# Patient Record
Sex: Male | Born: 1990
Health system: Southern US, Community
[De-identification: ages and names within clinical notes are randomized; demographics above are authoritative.]

## PROBLEM LIST (undated history)

## (undated) DIAGNOSIS — L649 Androgenic alopecia, unspecified: Secondary | ICD-10-CM

## (undated) HISTORY — DX: Androgenic alopecia, unspecified: L64.9

---

## 2011-06-15 ENCOUNTER — Emergency Department: Payer: Self-pay | Admitting: Emergency Medicine

## 2013-04-06 IMAGING — CR RIGHT ELBOW - COMPLETE 3+ VIEW
1 series · 4 of 4 positions shown · non-contrast
Comparison: none

REASON FOR EXAM: trauma
COMMENTS:

[Series 1: lat · 0.17mm/px · 4 of 4 slices shown]
[im 1/4]
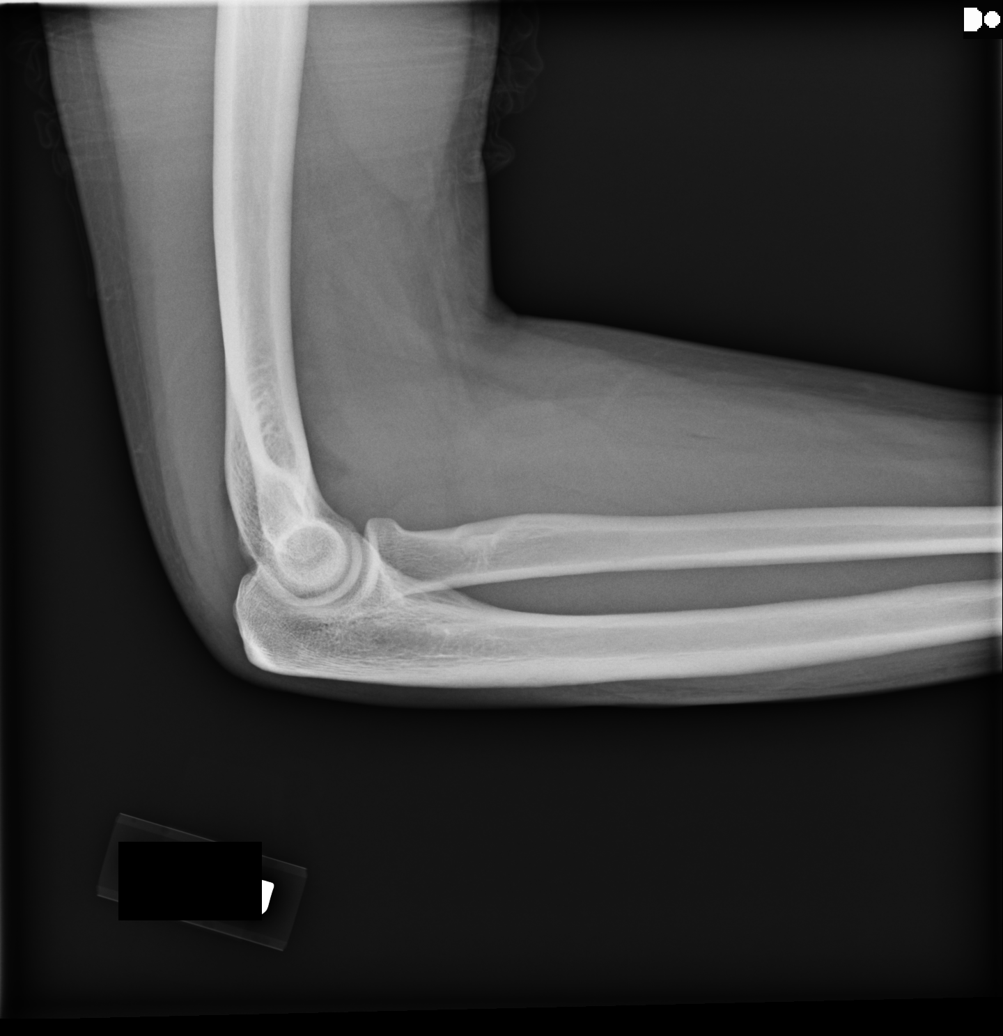
[im 2/4]
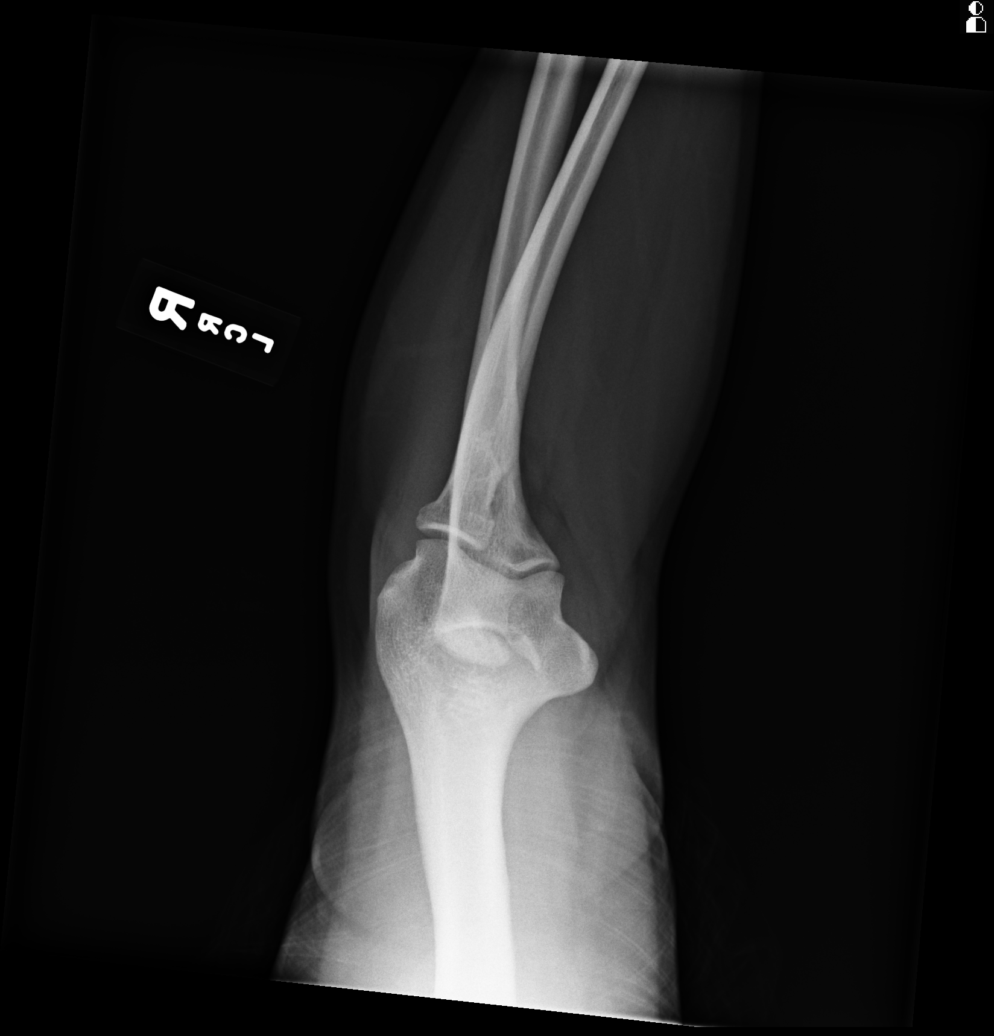
[im 3/4]
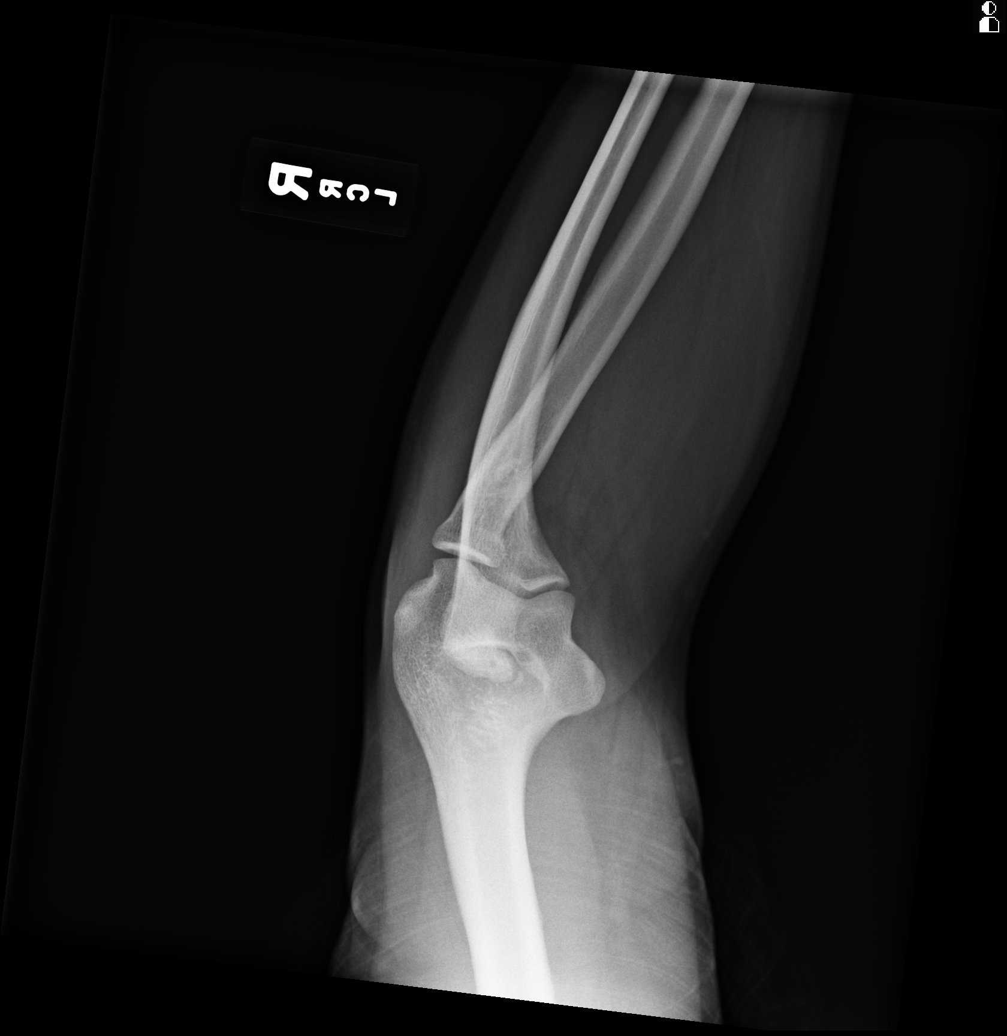
[im 4/4]
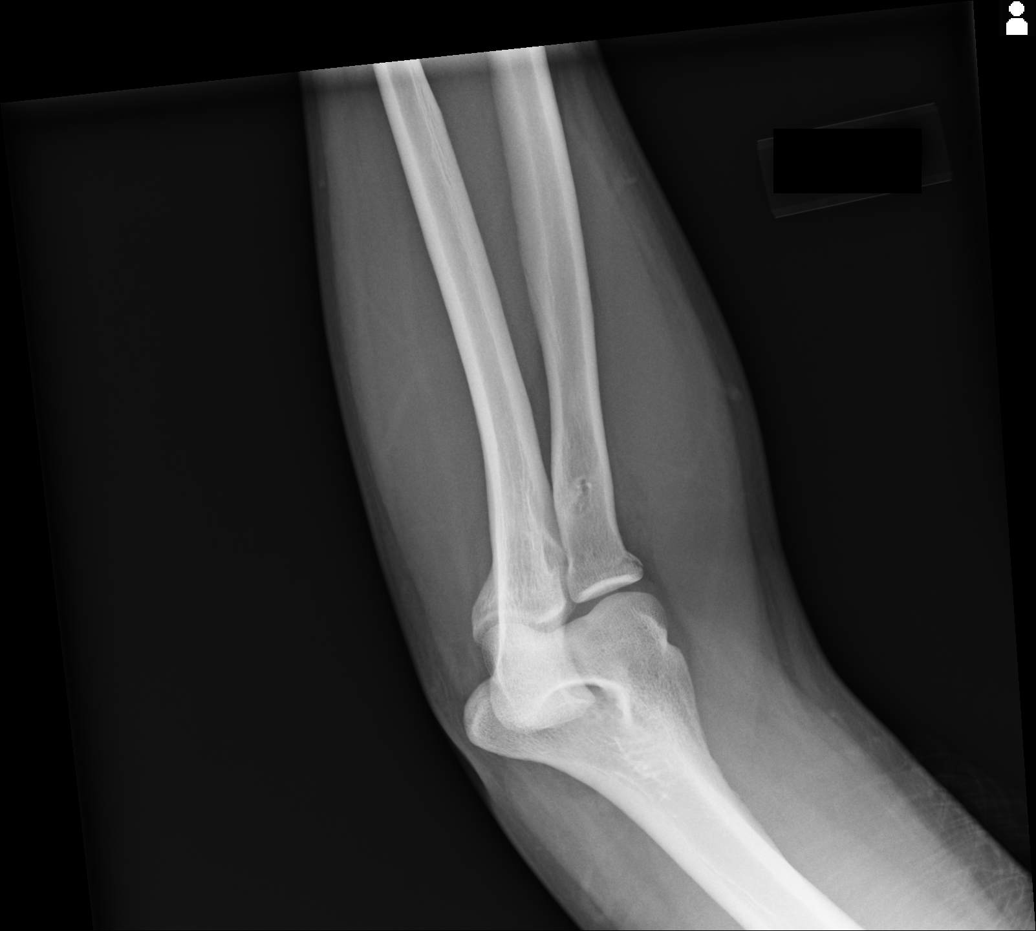

[4 of 4 positions shown; findings below may reference images not displayed]

PROCEDURE:     DXR - DXR ELBOW RT COMP W/OBLIQUES  - June 15, 2011  [DATE]

RESULT:     There is elevation of the anterior distal humeral fat pad
compatible with posttraumatic joint effusion. There is noted a minimally
displaced fracture of the radial head. No other fractures about the elbow
are seen.
IMPRESSION: 1. Fracture of the radial head, essentially nondisplaced.
2. There is elevation of the anterior distal humeral fat pad compatible with
posttraumatic joint effusion.

## 2019-12-10 ENCOUNTER — Encounter: Payer: Self-pay | Admitting: Internal Medicine

## 2019-12-10 ENCOUNTER — Ambulatory Visit (INDEPENDENT_AMBULATORY_CARE_PROVIDER_SITE_OTHER): Payer: Commercial Managed Care - PPO | Admitting: Internal Medicine

## 2019-12-10 ENCOUNTER — Other Ambulatory Visit: Payer: Self-pay

## 2019-12-10 VITALS — BP 145/89 | HR 113 | Ht 67.0 in | Wt 244.2 lb

## 2019-12-10 DIAGNOSIS — E669 Obesity, unspecified: Secondary | ICD-10-CM

## 2019-12-10 DIAGNOSIS — R438 Other disturbances of smell and taste: Secondary | ICD-10-CM | POA: Diagnosis not present

## 2019-12-10 DIAGNOSIS — L649 Androgenic alopecia, unspecified: Secondary | ICD-10-CM | POA: Diagnosis not present

## 2019-12-10 DIAGNOSIS — R430 Anosmia: Secondary | ICD-10-CM | POA: Diagnosis not present

## 2019-12-10 NOTE — Assessment & Plan Note (Signed)
-   I encouraged the patient to lose weight.  - I educated them on making healthy dietary choices including eating more fruits and vegetables and less fried foods. - I encouraged the patient to exercise more, and educated on the benefits of exercise including weight loss, diabetes prevention, and hypertension prevention.   

## 2019-12-10 NOTE — Assessment & Plan Note (Signed)
Obvious etiology of the    baldness is present

## 2019-12-10 NOTE — Assessment & Plan Note (Signed)
Patient sinus examination and throat examination is normal.  Advised to be tested for Covid.

## 2019-12-10 NOTE — Progress Notes (Signed)
Established Patient Office Visit  SUBJECTIVE:  Subjective  Patient ID: Luis Leblanc, male    DOB: 10/11/1990  Age: 29 y.o. MRN: [id number]  CC:  Chief Complaint  Patient presents with  . URI  . dry mouth    saliva taste bitter and having trouble tasting certain things    HPI Luis Leblanc is a 29 y.o. male presenting today for general checkup.  He complains of his tastet to be impaired.  He complains of water tasting bitter.  He cannot smell food,      He was not exposed to anyone with Covid.  Complaining of cough with without any phlegm.  Diarrhea  yesterday no history of nausea vomiting or fever.  Planing of back pain which is getting better denies any history of myalgia.  Past Medical History:  Diagnosis Date  . Male pattern baldness      History reviewed. No pertinent family history.  Social History   Socioeconomic History  . Marital status: Unknown    Spouse name: Not on file  . Number of children: Not on file  . Years of education: Not on file  . Highest education level: Not on file  Occupational History  . Not on file  Tobacco Use  . Smoking status: Never Smoker  . Smokeless tobacco: Never Used  Substance and Sexual Activity  . Alcohol use: Not Currently  . Drug use: Never  . Sexual activity: Not on file  Other Topics Concern  . Not on file  Social History Narrative  . Not on file   Social Determinants of Health   Financial Resource Strain:   . Difficulty of Paying Living Expenses: Not on file  Food Insecurity:   . Worried About Charity fundraiser in the Last Year: Not on file  . Ran Out of Food in the Last Year: Not on file  Transportation Needs:   . Lack of Transportation (Medical): Not on file  . Lack of Transportation (Non-Medical): Not on file  Physical Activity:   . Days of Exercise per Week: Not on file  . Minutes of Exercise per Session: Not on file  Stress:   . Feeling of Stress : Not on file  Social Connections:   . Frequency of  Communication with Friends and Family: Not on file  . Frequency of Social Gatherings with Friends and Family: Not on file  . Attends Religious Services: Not on file  . Active Member of Clubs or Organizations: Not on file  . Attends Archivist Meetings: Not on file  . Marital Status: Not on file  Intimate Partner Violence:   . Fear of Current or Ex-Partner: Not on file  . Emotionally Abused: Not on file  . Physically Abused: Not on file  . Sexually Abused: Not on file    No current outpatient medications on file.   No Known Allergies  ROS Review of Systems  Constitutional: Positive for fatigue.  HENT: Positive for sinus pressure and sore throat (taste is bitter  and  cant smell).   Eyes: Negative.   Respiratory: Negative.   Cardiovascular: Negative.   Gastrointestinal: Negative.   Endocrine: Negative.   Genitourinary: Negative.   Musculoskeletal: Negative.   Skin: Negative.   Allergic/Immunologic: Negative.   Neurological: Negative.   Hematological: Negative.   Psychiatric/Behavioral: Negative.   All other systems reviewed and are negative.    OBJECTIVE:    Physical Exam Vitals reviewed.  Constitutional:      Appearance: Normal  appearance.  HENT:     Mouth/Throat:     Mouth: Mucous membranes are moist.  Eyes:     Pupils: Pupils are equal, round, and reactive to light.  Neck:     Vascular: No carotid bruit.  Cardiovascular:     Rate and Rhythm: Normal rate and regular rhythm.     Pulses: Normal pulses.     Heart sounds: Normal heart sounds.  Pulmonary:     Effort: Pulmonary effort is normal.     Breath sounds: Normal breath sounds.  Abdominal:     General: Bowel sounds are normal.     Palpations: Abdomen is soft. There is no hepatomegaly, splenomegaly or mass.     Tenderness: There is no abdominal tenderness.     Hernia: No hernia is present.  Musculoskeletal:     Cervical back: Neck supple.     Right lower leg: No edema.     Left lower leg: No  edema.  Skin:    Findings: No rash.  Neurological:     Mental Status: He is alert and oriented to person, place, and time.     Motor: No weakness.  Psychiatric:        Mood and Affect: Mood normal.        Behavior: Behavior normal.     BP (!) 145/89   Pulse (!) 113   Ht 5' 7" (1.702 m)   Wt 244 lb 3.2 oz (110.8 kg)   BMI 38.25 kg/m  Wt Readings from Last 3 Encounters:  12/10/19 244 lb 3.2 oz (110.8 kg)    Health Maintenance Due  Topic Date Due  . Hepatitis C Screening  Never done  . COVID-19 Vaccine (1) Never done  . HIV Screening  Never done  . TETANUS/TDAP  Never done  . INFLUENZA VACCINE  11/16/2019    There are no preventive care reminders to display for this patient.  No flowsheet data found. No flowsheet data found.  No results found for: TSH No results found for: ALBUMIN, ANIONGAP, EGFR, GFR No results found for: CHOL, HDL, LDLCALC, CHOLHDL No results found for: TRIG No results found for: HGBA1C    ASSESSMENT & PLAN:   Problem List Items Addressed This Visit      Endocrine   Male pattern baldness    Obvious etiology of the    baldness is present        Other   Anosmia    Patient sinus examination and throat examination is normal.  Advised to be tested for Covid.      Bitter taste - Primary    Refer for Covid test.      Obesity (BMI 35.0-39.9 without comorbidity)    - I encouraged the patient to lose weight.  - I educated them on making healthy dietary choices including eating more fruits and vegetables and less fried foods. - I encouraged the patient to exercise more, and educated on the benefits of exercise including weight loss, diabetes prevention, and hypertension prevention.           No orders of the defined types were placed in this encounter.     Follow-up: No follow-ups on file.    Dr. Jane Canary [facility] [address] [place], [place] [address]   By signing my name below, I, [employer], attest  that this documentation has been prepared under the direction of Cletis Athens, MD. Electronically Signed: Cletis Athens, MD 12/10/19, 1:26 PM   I  personally performed the services described in this documentation, which was SCRIBED in my presence. The recorded information has been reviewed and considered accurate. It has been edited as necessary during review. Cletis Athens, MD

## 2019-12-10 NOTE — Assessment & Plan Note (Signed)
Refer for Covid test.

## 2019-12-19 ENCOUNTER — Ambulatory Visit: Payer: Commercial Managed Care - PPO | Admitting: Internal Medicine

## 2020-01-06 ENCOUNTER — Ambulatory Visit: Payer: Commercial Managed Care - PPO | Admitting: Internal Medicine
